# Patient Record
Sex: Female | Born: 1985 | Race: Black or African American | Hispanic: No | Marital: Single | Smoking: Current every day smoker
Health system: Southern US, Community
[De-identification: ages and names within clinical notes are randomized; demographics above are authoritative.]

## PROBLEM LIST (undated history)

## (undated) DIAGNOSIS — K8020 Calculus of gallbladder without cholecystitis without obstruction: Secondary | ICD-10-CM

---

## 2006-10-18 ENCOUNTER — Emergency Department (HOSPITAL_COMMUNITY): Admission: EM | Admit: 2006-10-18 | Discharge: 2006-10-18 | Payer: Self-pay | Admitting: Emergency Medicine

## 2007-09-14 ENCOUNTER — Ambulatory Visit (HOSPITAL_COMMUNITY): Admission: RE | Admit: 2007-09-14 | Discharge: 2007-09-14 | Payer: Self-pay | Admitting: Obstetrics and Gynecology

## 2009-02-01 IMAGING — US US OB DETAIL+14 WK
1 series · 18 of 28 positions shown · non-contrast
Comparison: none

OBSTETRICAL ULTRASOUND:
 This ultrasound was performed in The [HOSPITAL], and the [facility] report will be stored to [REDACTED] PACS.

[Series 1: us ob detail+14 wk · 18 of 83 slices shown]
[im 1/83]
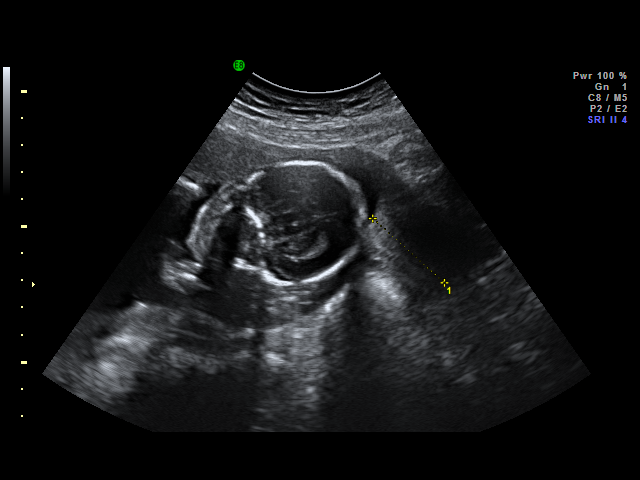
[im 7/83]
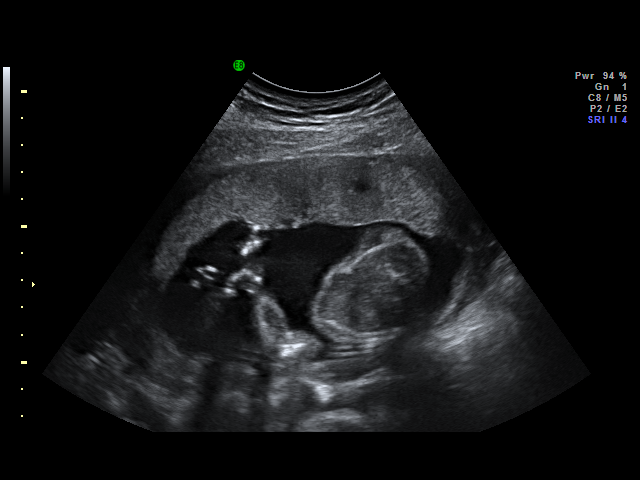
[im 10/83]
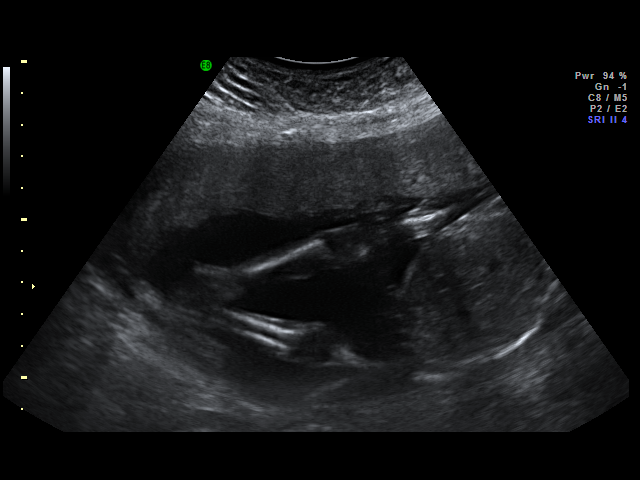
[im 16/83]
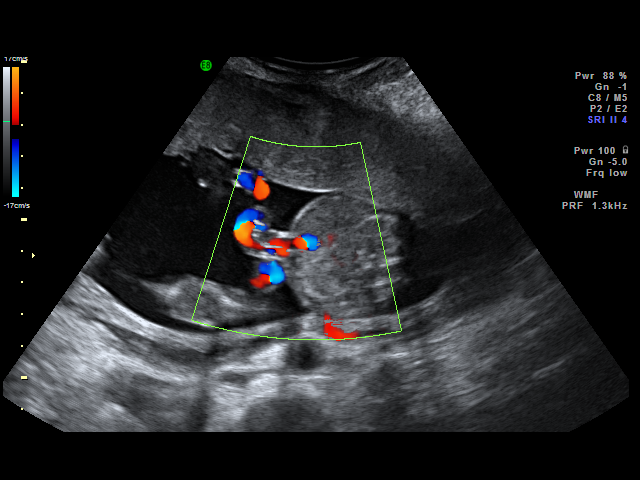
[im 22/83]
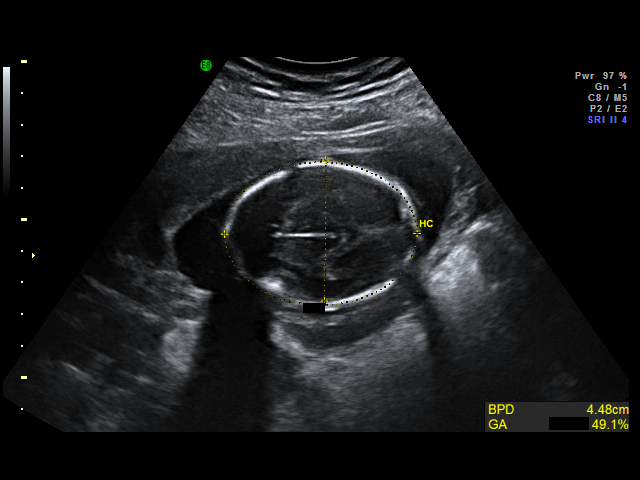
[im 25/83]
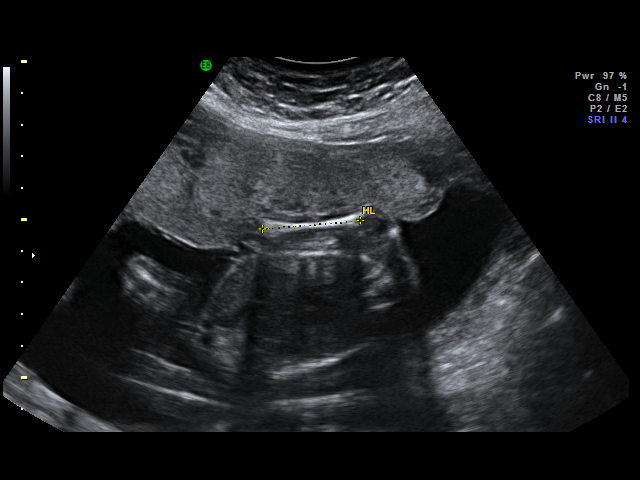
[im 31/83]
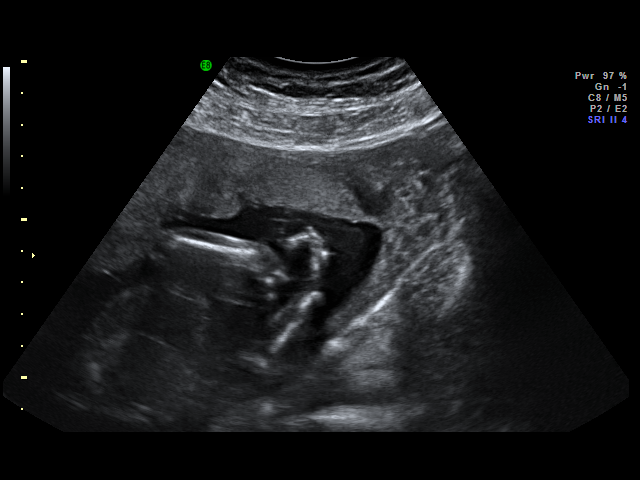
[im 34/83]
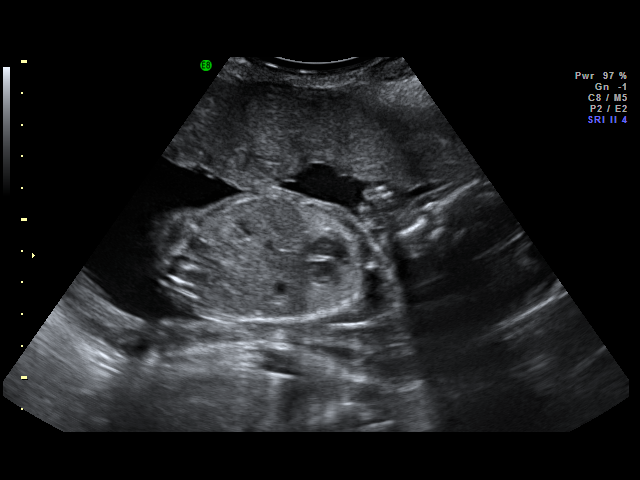
[im 40/83]
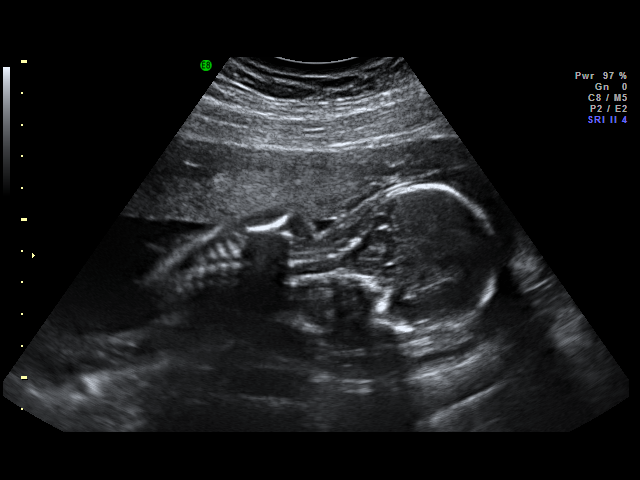
[im 43/83]
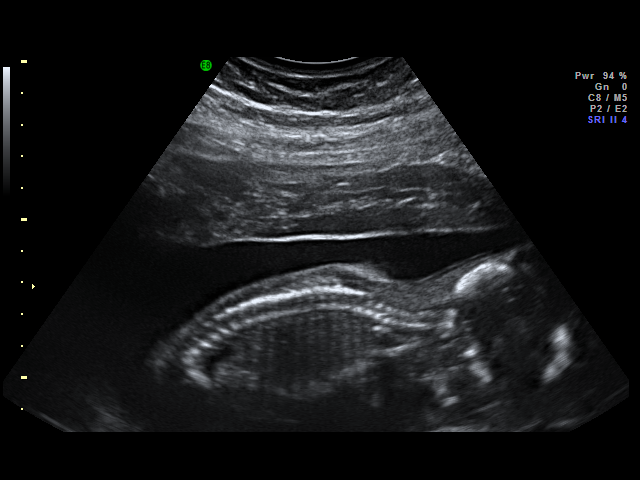
[im 49/83]
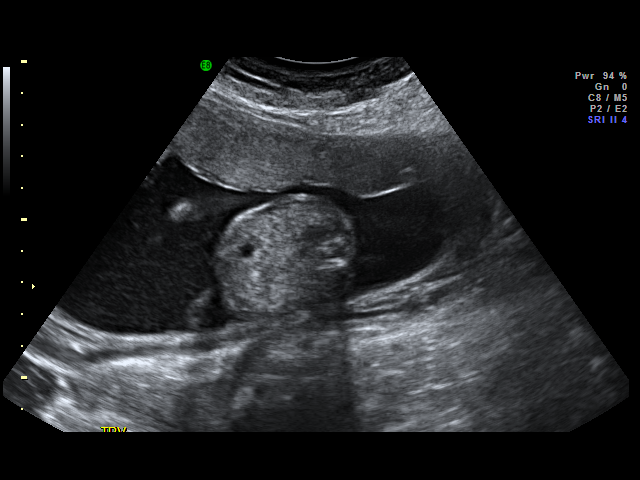
[im 52/83]
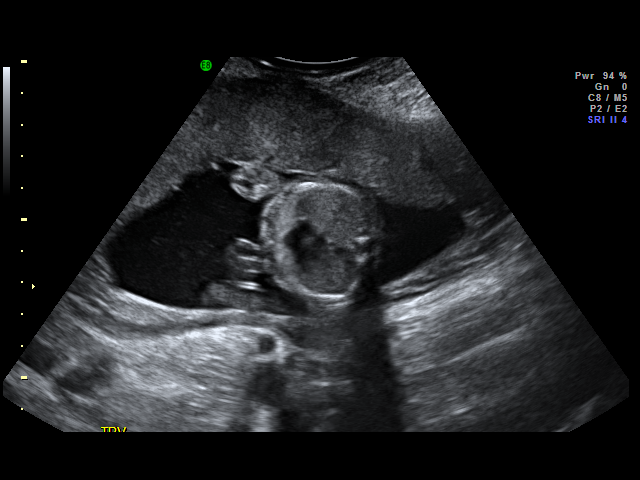
[im 58/83]
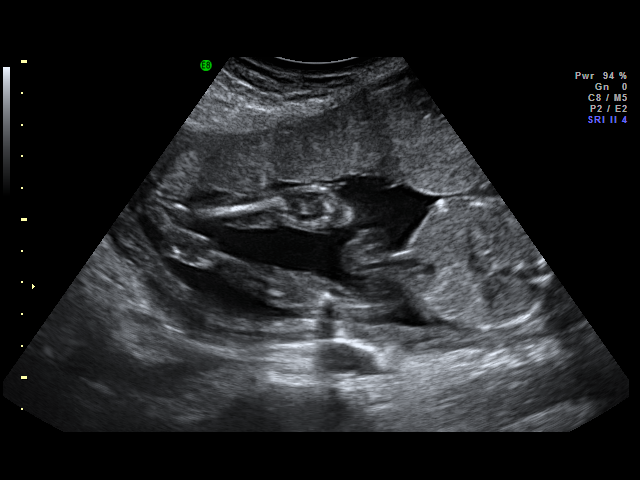
[im 64/83]
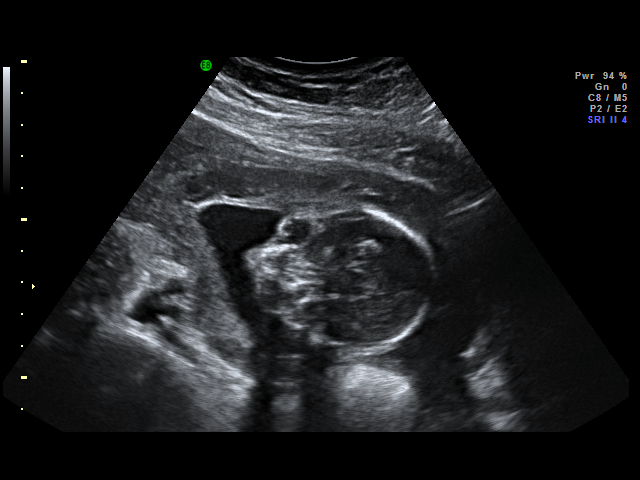
[im 67/83]
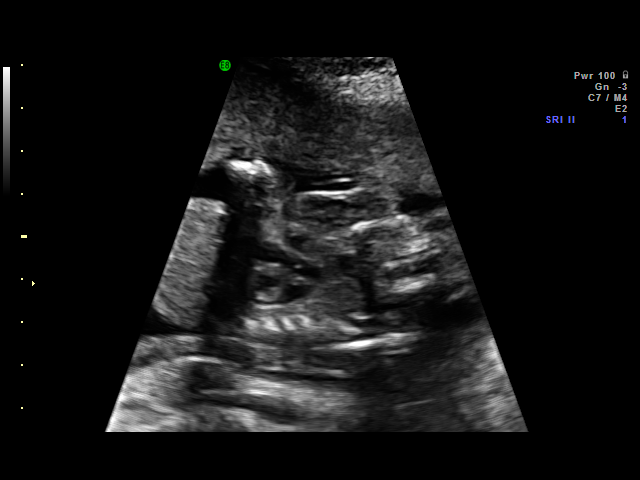
[im 73/83]
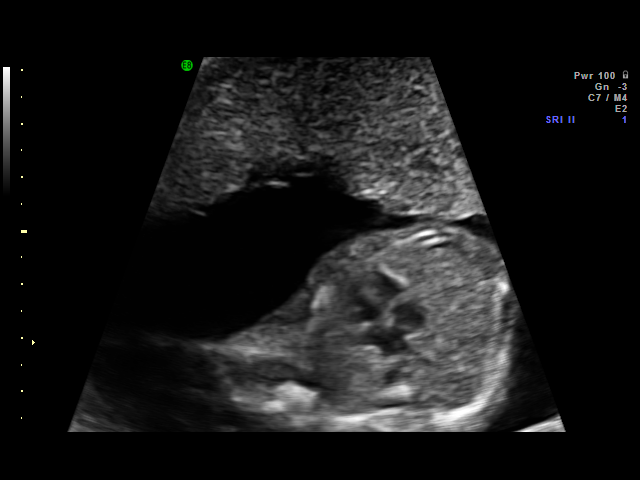
[im 76/83]
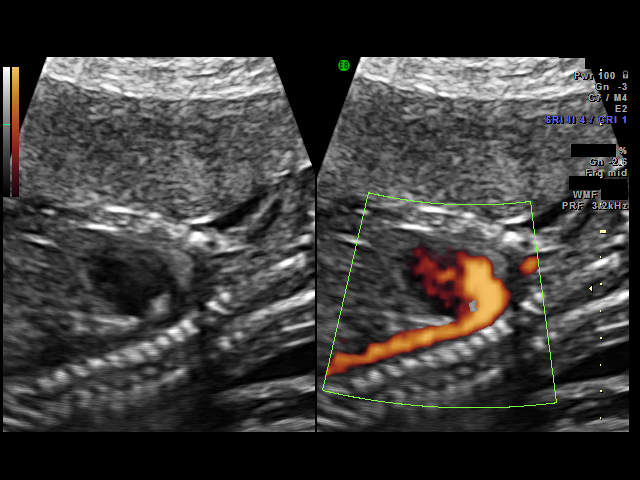
[im 83/83]
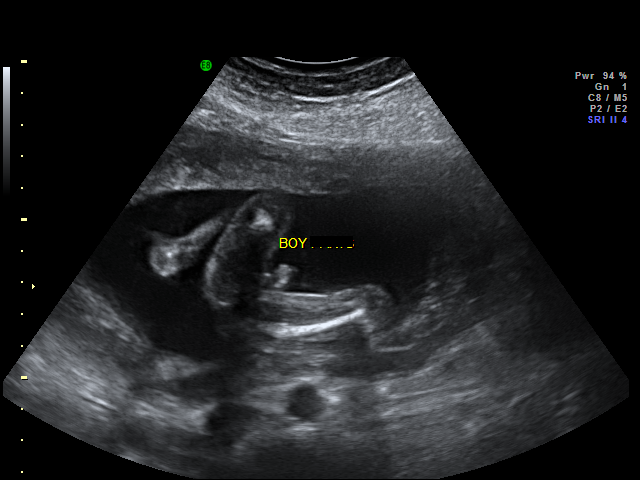

[18 of 28 positions shown; findings below may reference images not displayed]

IMPRESSION: [facility] has also been faxed to the ordering physician.

## 2011-01-10 LAB — URINE MICROSCOPIC-ADD ON

## 2011-01-10 LAB — POCT PREGNANCY, URINE: Operator id: 285491

## 2011-01-10 LAB — URINALYSIS, ROUTINE W REFLEX MICROSCOPIC
Bilirubin Urine: NEGATIVE
Glucose, UA: NEGATIVE
Ketones, ur: NEGATIVE
Nitrite: NEGATIVE
Protein, ur: NEGATIVE
pH: 7

## 2011-01-10 LAB — WET PREP, GENITAL
Clue Cells Wet Prep HPF POC: NONE SEEN
Trich, Wet Prep: NONE SEEN

## 2012-11-28 ENCOUNTER — Emergency Department (HOSPITAL_BASED_OUTPATIENT_CLINIC_OR_DEPARTMENT_OTHER)
Admission: EM | Admit: 2012-11-28 | Discharge: 2012-11-28 | Disposition: A | Discharge status: Home / Self Care | Payer: Self-pay | Attending: Emergency Medicine | Admitting: Emergency Medicine

## 2012-11-28 ENCOUNTER — Encounter (HOSPITAL_BASED_OUTPATIENT_CLINIC_OR_DEPARTMENT_OTHER): Payer: Self-pay | Admitting: Emergency Medicine

## 2012-11-28 DIAGNOSIS — Z87891 Personal history of nicotine dependence: Secondary | ICD-10-CM | POA: Insufficient documentation

## 2012-11-28 DIAGNOSIS — L539 Erythematous condition, unspecified: Secondary | ICD-10-CM | POA: Insufficient documentation

## 2012-11-28 DIAGNOSIS — Z79899 Other long term (current) drug therapy: Secondary | ICD-10-CM | POA: Insufficient documentation

## 2012-11-28 DIAGNOSIS — B86 Scabies: Secondary | ICD-10-CM | POA: Insufficient documentation

## 2012-11-28 MED ORDER — PERMETHRIN 5 % EX CREA: TOPICAL_CREAM | CUTANEOUS | Status: AC

## 2012-11-28 NOTE — ED Provider Notes (Signed)
Medical screening examination/treatment/procedure(s) were performed by non-physician practitioner and as supervising physician I was immediately available for consultation/collaboration.  Geoffery Lyons, MD 11/28/12 [phone]

## 2012-11-28 NOTE — ED Provider Notes (Signed)
CSN: [id number]     Arrival date & time 11/28/12  1336 History   First MD Initiated Contact with Patient 11/28/12 1351     Chief Complaint  Patient presents with  . Rash   (Consider location/radiation/quality/duration/timing/severity/associated sxs/prior Treatment) Patient is a 27 y.o. female presenting with rash. The history is provided by the patient. No language interpreter was used.  Rash Location:  Full body Quality: itchiness   Severity:  Mild Onset quality:  Gradual Timing:  Constant Relieved by:  Nothing Worsened by:  Nothing tried Ineffective treatments:  None tried Pt complains of itching, worse between fingers and on toes  History reviewed. No pertinent past medical history. History reviewed. No pertinent past surgical history. No family history on file. History  Substance Use Topics  . Smoking status: Former Games developer  . Smokeless tobacco: Not on file  . Alcohol Use: Yes     Comment: occ   OB History   Grav Para Term Preterm Abortions TAB SAB Ect Mult Living                 Review of Systems  Skin: Positive for rash.  All other systems reviewed and are negative.    Allergies  Review of patient's allergies indicates no known allergies.  Home Medications   Current Outpatient Rx  Name  Route  Sig  Dispense  Refill  . Multiple Vitamin (MULTIVITAMIN WITH MINERALS) TABS tablet   Oral   Take 1 tablet by mouth daily.          BP 109/69  Pulse 72  Temp(Src) 98.3 F (36.8 C) (Oral)  Resp 16  Ht 5' 9.5" (1.765 m)  Wt 196 lb (88.905 kg)  BMI 28.54 kg/m2  SpO2 100%  LMP 11/02/2012 Physical Exam  Vitals reviewed. Constitutional: She is oriented to person, place, and time. She appears well-nourished.  HENT:  Head: Normocephalic and atraumatic.  Musculoskeletal: Normal range of motion.  Neurological: She is alert and oriented to person, place, and time. She has normal reflexes.  Skin: There is erythema.  Psychiatric: She has a normal mood and affect.     ED Course  Procedures (including critical care time) Labs Review Labs Reviewed - No data to display Imaging Review No results found.  MDM  No diagnosis found.  elemite   Elson Areas, PA-C 11/28/12 1409

## 2012-11-28 NOTE — ED Notes (Signed)
Scattered itchy rash x1 week. Started on feet.  On knees x3 days. Now on back and hands.

## 2018-10-16 ENCOUNTER — Emergency Department (HOSPITAL_BASED_OUTPATIENT_CLINIC_OR_DEPARTMENT_OTHER)
Admission: EM | Admit: 2018-10-16 | Discharge: 2018-10-16 | Disposition: A | Discharge status: Home / Self Care | Payer: BC Managed Care – PPO | Attending: Emergency Medicine | Admitting: Emergency Medicine

## 2018-10-16 ENCOUNTER — Encounter (HOSPITAL_BASED_OUTPATIENT_CLINIC_OR_DEPARTMENT_OTHER): Payer: Self-pay | Admitting: Emergency Medicine

## 2018-10-16 ENCOUNTER — Telehealth (HOSPITAL_BASED_OUTPATIENT_CLINIC_OR_DEPARTMENT_OTHER): Payer: Self-pay | Admitting: *Deleted

## 2018-10-16 ENCOUNTER — Other Ambulatory Visit: Payer: Self-pay

## 2018-10-16 ENCOUNTER — Ambulatory Visit (HOSPITAL_BASED_OUTPATIENT_CLINIC_OR_DEPARTMENT_OTHER)
Admit: 2018-10-16 | Discharge: 2018-10-16 | Disposition: A | Discharge status: Home / Self Care | Payer: BC Managed Care – PPO | Attending: Emergency Medicine | Admitting: Emergency Medicine

## 2018-10-16 DIAGNOSIS — K8050 Calculus of bile duct without cholangitis or cholecystitis without obstruction: Secondary | ICD-10-CM | POA: Diagnosis not present

## 2018-10-16 DIAGNOSIS — K8020 Calculus of gallbladder without cholecystitis without obstruction: Secondary | ICD-10-CM

## 2018-10-16 DIAGNOSIS — R1011 Right upper quadrant pain: Secondary | ICD-10-CM | POA: Diagnosis present

## 2018-10-16 DIAGNOSIS — Z79899 Other long term (current) drug therapy: Secondary | ICD-10-CM | POA: Diagnosis not present

## 2018-10-16 DIAGNOSIS — Z3202 Encounter for pregnancy test, result negative: Secondary | ICD-10-CM | POA: Insufficient documentation

## 2018-10-16 DIAGNOSIS — F17200 Nicotine dependence, unspecified, uncomplicated: Secondary | ICD-10-CM | POA: Insufficient documentation

## 2018-10-16 HISTORY — DX: Calculus of gallbladder without cholecystitis without obstruction: K80.20

## 2018-10-16 LAB — COMPREHENSIVE METABOLIC PANEL
ALT: 12 U/L (ref 0–44)
AST: 13 U/L — ABNORMAL LOW (ref 15–41)
Albumin: 4.1 g/dL (ref 3.5–5.0)
Alkaline Phosphatase: 60 U/L (ref 38–126)
Anion gap: 8 (ref 5–15)
BUN: 9 mg/dL (ref 6–20)
CO2: 26 mmol/L (ref 22–32)
Calcium: 9.2 mg/dL (ref 8.9–10.3)
Chloride: 103 mmol/L (ref 98–111)
Creatinine, Ser: 0.83 mg/dL (ref 0.44–1.00)
GFR calc Af Amer: >60 mL/min (ref >60)
GFR calc non Af Amer: >60 mL/min (ref >60)
Glucose, Bld: 100 mg/dL — ABNORMAL HIGH (ref 70–99)
Potassium: 3.7 mmol/L (ref 3.5–5.1)
Sodium: 137 mmol/L (ref 135–145)
Total Bilirubin: 0.3 mg/dL (ref 0.3–1.2)
Total Protein: 7.6 g/dL (ref 6.5–8.1)

## 2018-10-16 LAB — CBC
HCT: 42.7 % (ref 36.0–46.0)
Hemoglobin: 13.2 g/dL (ref 12.0–15.0)
MCH: 27.6 pg (ref 26.0–34.0)
MCHC: 30.9 g/dL (ref 30.0–36.0)
MCV: 89.3 fL (ref 80.0–100.0)
Platelets: 325 10*3/uL (ref 150–400)
RBC: 4.78 MIL/uL (ref 3.87–5.11)
RDW: 13.8 % (ref 11.5–15.5)
WBC: 12.7 10*3/uL — ABNORMAL HIGH (ref 4.0–10.5)
nRBC: 0 % (ref 0.0–0.2)

## 2018-10-16 LAB — URINALYSIS, ROUTINE W REFLEX MICROSCOPIC
Bilirubin Urine: NEGATIVE
Glucose, UA: NEGATIVE mg/dL
Hgb urine dipstick: NEGATIVE
Ketones, ur: NEGATIVE mg/dL
Leukocytes,Ua: NEGATIVE
Nitrite: NEGATIVE
Protein, ur: NEGATIVE mg/dL
Specific Gravity, Urine: 1.025 (ref 1.005–1.030)
pH: 6 (ref 5.0–8.0)

## 2018-10-16 LAB — LIPASE, BLOOD: Lipase: 43 U/L (ref 11–51)

## 2018-10-16 LAB — PREGNANCY, URINE: Preg Test, Ur: NEGATIVE

## 2018-10-16 MED ORDER — ONDANSETRON HCL 4 MG/2ML IJ SOLN
4.0000 mg | Freq: Once | INTRAMUSCULAR | Status: AC | PRN
  Administered 2018-10-16: 4 mg via INTRAVENOUS
  Filled 2018-10-16: qty 2

## 2018-10-16 MED ORDER — FENTANYL CITRATE (PF) 100 MCG/2ML IJ SOLN
50.0000 ug | INTRAMUSCULAR | Status: DC | PRN
  Administered 2018-10-16: 02:00:00 50 ug via INTRAVENOUS
  Filled 2018-10-16: qty 2

## 2018-10-16 NOTE — ED Provider Notes (Signed)
[facility] EMERGENCY DEPARTMENT Provider Note   CSN: [id number] Arrival date & time: 10/16/18  0107    History   Chief Complaint Chief Complaint  Patient presents with  . Abdominal Pain    HPI Audrey Baldwin is a 33 y.o. female.   The history is provided by the patient.  Abdominal Pain She has history of gallstones and comes in because of right upper quadrant pain radiating to the back.  Pain started about 7 PM after she had eaten some macaroni and cheese.  There is associated nausea and vomiting.  Nothing made the pain better, nothing made it worse.  Pain was rated at 10/10.  It had resolved by the time I had seen her.  She states that she has had similar pain in the past, but not this severe.  About 10 years ago, she did have an ultrasound which showed gallstones, but nothing was done about it.  Past Medical History:  Diagnosis Date  . Gall stones     There are no active problems to display for this patient.   History reviewed. No pertinent surgical history.   OB History   No obstetric history on file.      Home Medications    Prior to Admission medications   Medication Sig Start Date End Date Taking? Authorizing Provider  Multiple Vitamin (MULTIVITAMIN WITH MINERALS) TABS tablet Take 1 tablet by mouth daily.    [provider]  permethrin (ELIMITE) 5 % cream Apply to affected area once 11/28/12   Elson AreasSofia, Leslie K, PA-C    Family History No family history on file.  Social History Social History   Tobacco Use  . Smoking status: Current Every Day Smoker  . Smokeless tobacco: Never Used  Substance Use Topics  . Alcohol use: Yes    Comment: occ  . Drug use: No     Allergies   Patient has no known allergies.   Review of Systems Review of Systems  Gastrointestinal: Positive for abdominal pain.  All other systems reviewed and are negative.    Physical Exam Updated Vital Signs BP (!) 150/97   Pulse 85   Temp 98.6 F (37 C)  (Oral)   Resp 18   Ht 5\' 9"  (1.753 m)   Wt 103.4 kg   LMP 10/01/2018 (Exact Date)   SpO2 100%   BMI 33.67 kg/m   Physical Exam Vitals signs and nursing note reviewed.    33 year old female, resting comfortably and in no acute distress. Vital signs are significant for elevated blood pressure. Oxygen saturation is 100%, which is normal. Head is normocephalic and atraumatic. PERRLA, EOMI. Oropharynx is clear. Neck is nontender and supple without adenopathy or JVD. Back is nontender and there is no CVA tenderness. Lungs are clear without rales, wheezes, or rhonchi. Chest is nontender. Heart has regular rate and rhythm without murmur. Abdomen is soft, flat, nontender without masses or hepatosplenomegaly and peristalsis is normoactive. Extremities have no cyanosis or edema, full range of motion is present. Skin is warm and dry without rash. Neurologic: Mental status is normal, cranial nerves are intact, there are no motor or sensory deficits.  ED Treatments / Results  Labs (all labs ordered are listed, but only abnormal results are displayed) Labs Reviewed  COMPREHENSIVE METABOLIC PANEL - Abnormal; Notable for the following components:      Result Value   Glucose, Bld 100 (*)    AST 13 (*)    All other components within normal  limits  CBC - Abnormal; Notable for the following components:   WBC 12.7 (*)    All other components within normal limits  URINALYSIS, ROUTINE W REFLEX MICROSCOPIC - Abnormal; Notable for the following components:   APPearance CLOUDY (*)    All other components within normal limits  LIPASE, BLOOD  PREGNANCY, URINE   Procedures Procedures  Medications Ordered in ED Medications  fentaNYL (SUBLIMAZE) injection 50 mcg (50 mcg Intravenous Given 10/16/18 0157)  ondansetron (ZOFRAN) injection 4 mg (4 mg Intravenous Given 10/16/18 0156)     Initial Impression / Assessment and Plan / ED Course  I have reviewed the triage vital signs and the nursing notes.   Pertinent lab results that were available during my care of the patient were reviewed by me and considered in my medical decision making (see chart for details).  Acute biliary colic which has resolved.  Labs are significant only for mild leukocytosis.  Old records are reviewed, and she has no relevant past visits.  I have reviewed records on care everywhere, but I can find no record of her ultrasound.  I had an extended discussion with the patient about management of gallbladder disease.  She will be brought back in the morning for ultrasound to confirm presence of gallstones, and is referred to general surgery for elective cholecystectomy.  Advised to stay on a low-fat diet until then.  Return precautions discussed.  Final Clinical Impressions(s) / ED Diagnoses   Final diagnoses:  Biliary colic    ED Discharge Orders    None       Delora Fuel, MD 45/62/56 [phone]

## 2018-10-16 NOTE — Discharge Instructions (Addendum)
Stay on a low fat diet.  Return in the morning for an ultrasound of your gallbladder to confirm you still have gallstones.  Follow up with the surgeon to make arrangements to have your gallbladder removed.  Return if you have severe pain like you had tonight.

## 2018-10-16 NOTE — Telephone Encounter (Signed)
Pt presents to registration requesting results of her u/s. Dr. Sherry Ruffing has reviewed her chart and [place] results and asks that I

## 2018-10-16 NOTE — ED Notes (Signed)
RUQ pain off and on today, increased after eating.

## 2018-10-16 NOTE — ED Triage Notes (Addendum)
Pt c/o RUQ abd pain radiating to back. Pt reports vomiting x 1 episode.

## 2018-10-16 NOTE — ED Notes (Signed)
Radiology in to see pt to set up U/S appt.

## 2020-03-05 IMAGING — US ULTRASOUND ABDOMEN LIMITED
1 series · 14 of 25 positions shown · non-contrast
Comparison: CT 02/20/2008 report.

CLINICAL DATA: History of gallstones.  Right upper quadrant pain.

EXAM:
ULTRASOUND ABDOMEN LIMITED RIGHT UPPER QUADRANT

[Series 1: ultrasound abdomen limited · 14 of 77 slices shown]
[im 1/77]
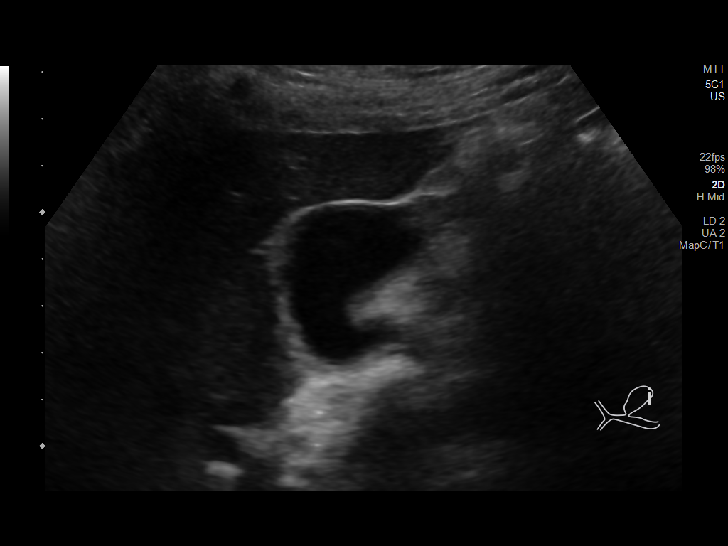
[im 7/77]
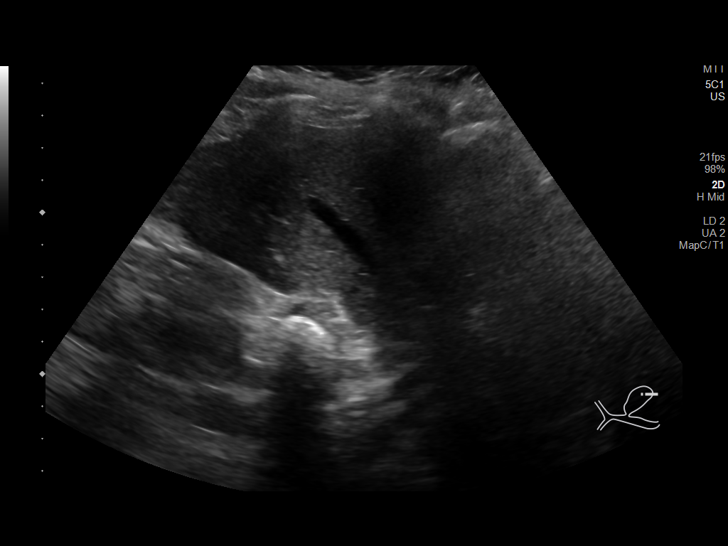
[im 13/77]
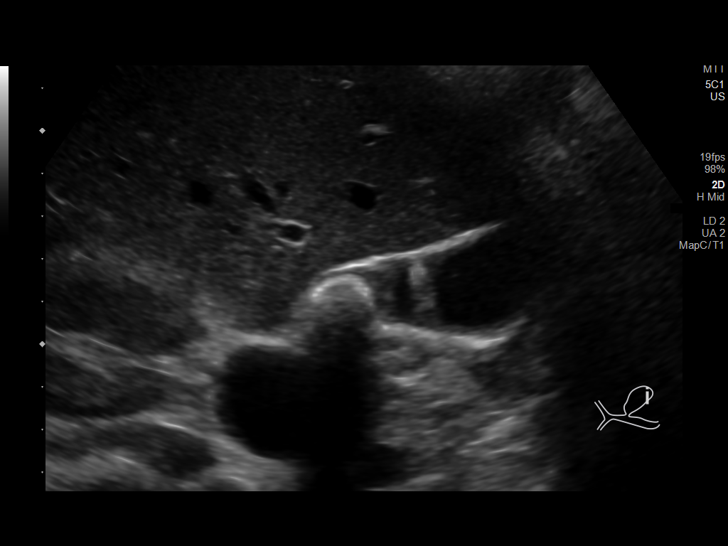
[im 20/77]
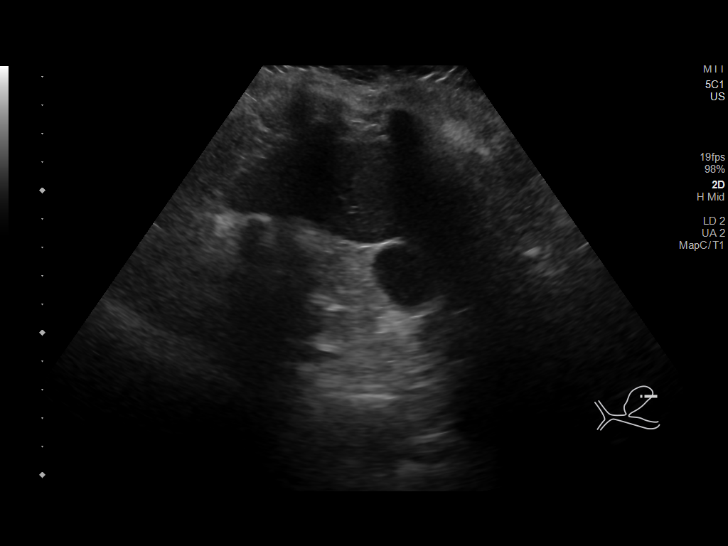
[im 26/77]
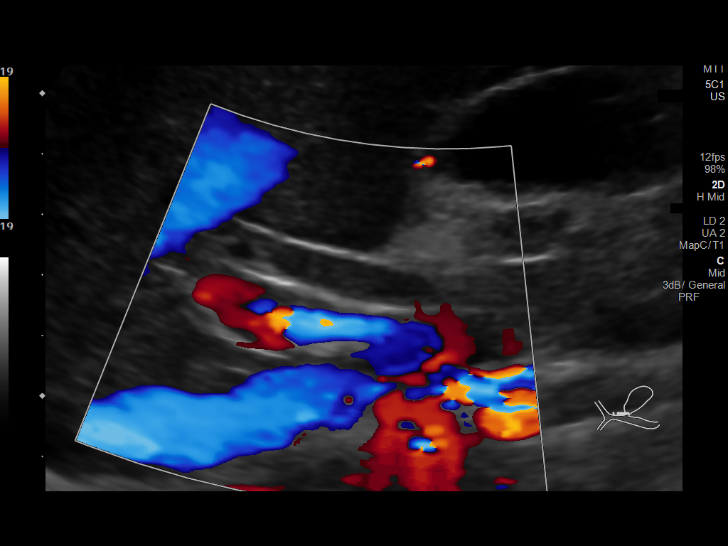
[im 29/77]
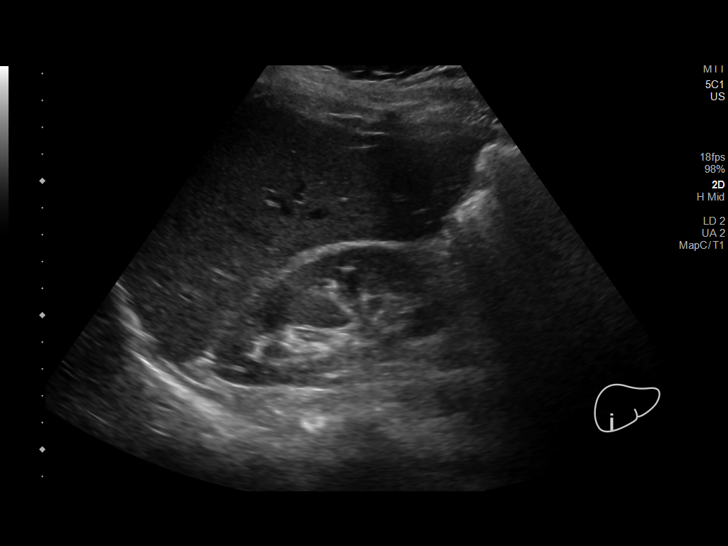
[im 35/77]
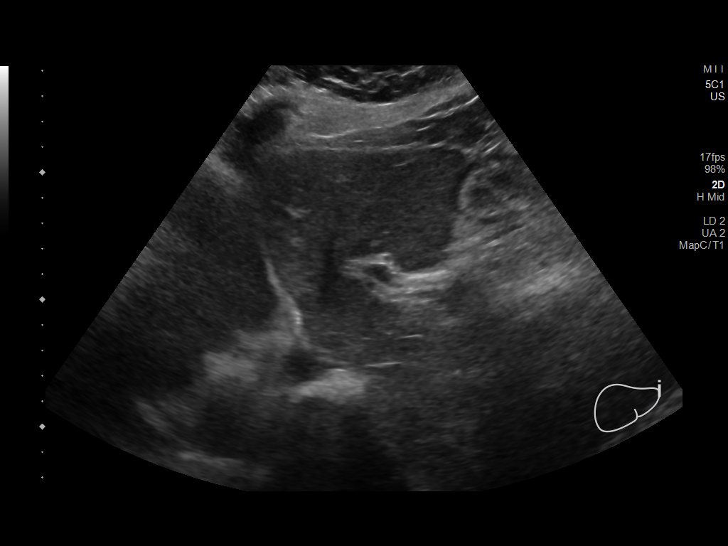
[im 42/77]
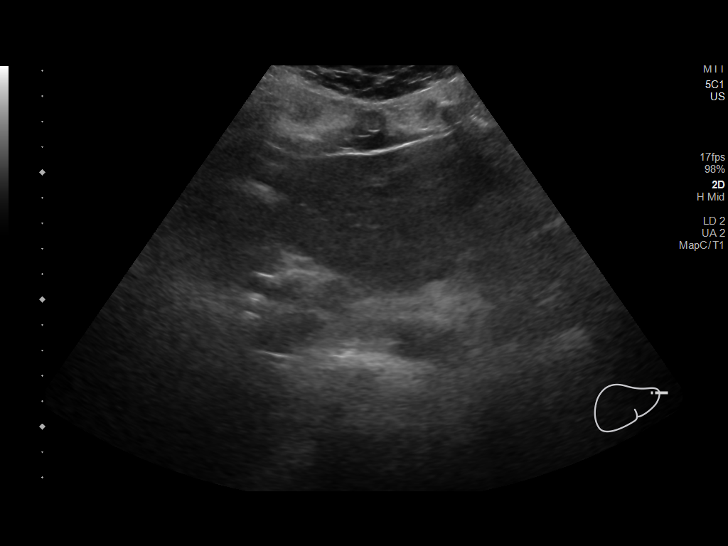
[im 48/77]
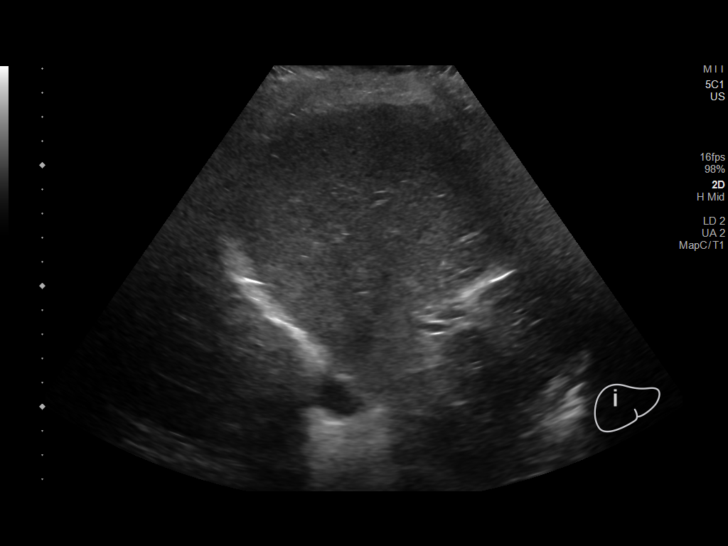
[im 51/77]
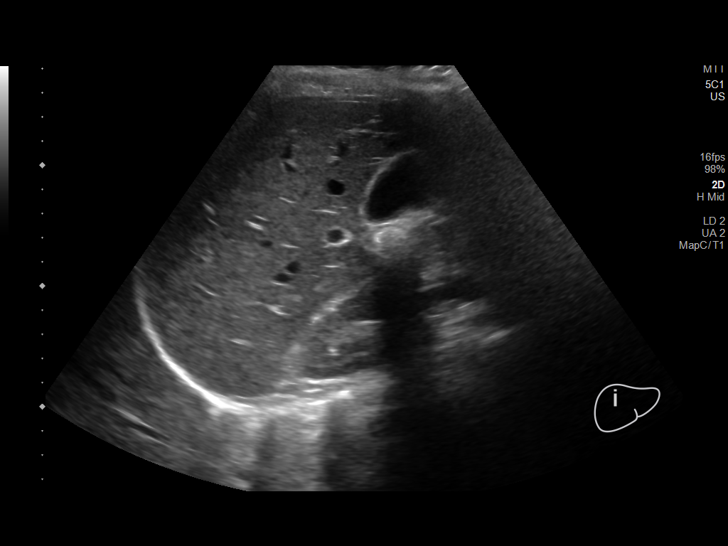
[im 58/77]
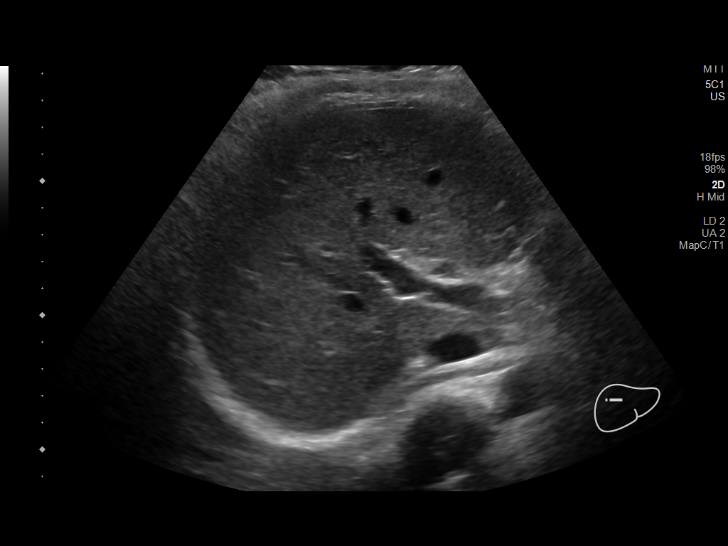
[im 64/77]
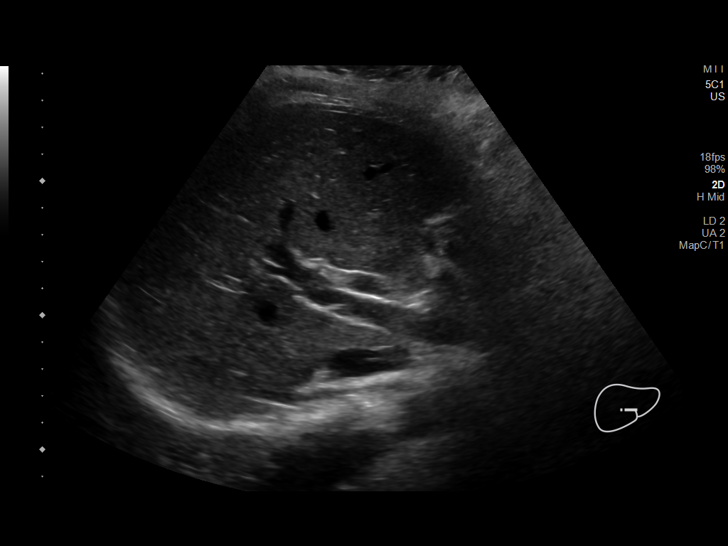
[im 70/77]
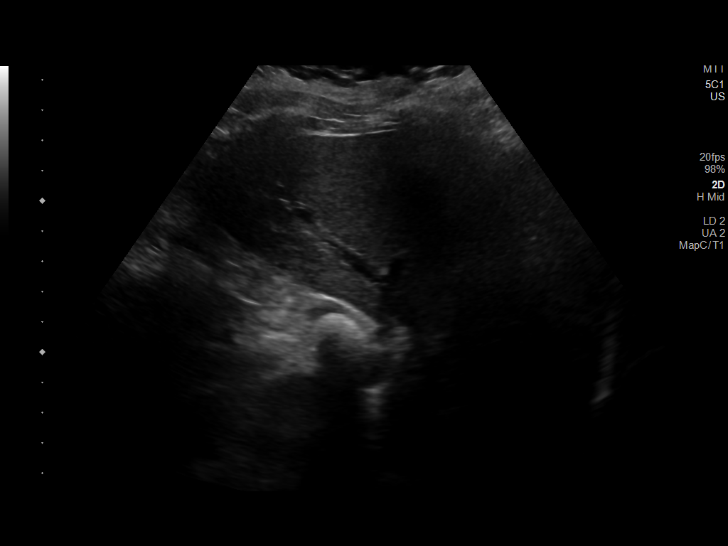
[im 77/77]
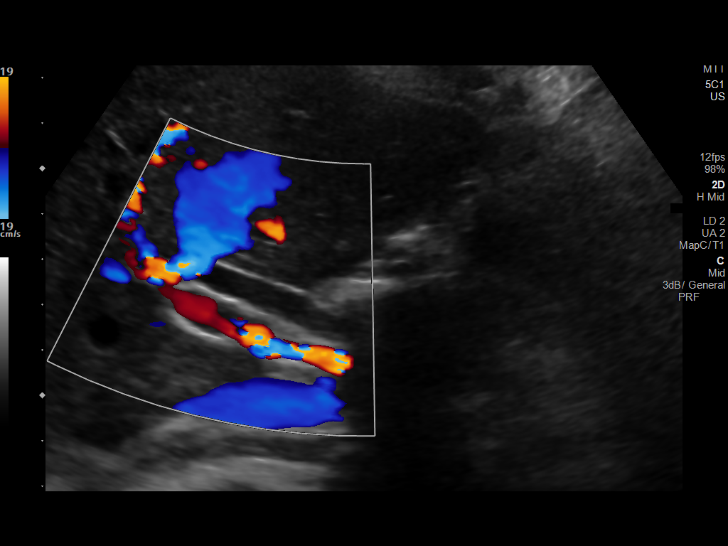

[14 of 25 positions shown; findings below may reference images not displayed]

FINDINGS: Gallbladder:

1.9 cm non mobile echogenic focus with posterior wall shadowing
consistent with gallstone noted in the neck of the gallbladder.
Gallbladder wall thickening to 3.6 mm with streak artifact noted.
These changes could be related cholecystitis and or adenomyomatosis.

Common bile duct:

Diameter: 6.7 mm

Liver:

No focal lesion identified. Within normal limits in parenchymal
echogenicity. Portal vein is patent on color Doppler imaging with
normal direction of blood flow towards the liver.
IMPRESSION: 1.9 cm impacted stone in the neck of the gallbladder. Thickened
gallbladder wall with streak artifact. Cholecystitis and or
adenomyomatosis could present this fashion.
